# Patient Record
Sex: Female | Born: 1935 | Race: White | Hispanic: No
Health system: Southern US, Community
[De-identification: ages and names within clinical notes are randomized; demographics above are authoritative.]

---

## 2007-04-11 ENCOUNTER — Inpatient Hospital Stay: Payer: Self-pay | Admitting: Internal Medicine

## 2007-04-19 ENCOUNTER — Inpatient Hospital Stay: Payer: Self-pay | Admitting: General Surgery

## 2007-04-19 ENCOUNTER — Other Ambulatory Visit: Payer: Self-pay

## 2007-04-20 ENCOUNTER — Other Ambulatory Visit: Payer: Self-pay

## 2008-04-05 IMAGING — CT CT ABD-PELV W/O CM
1 of 3 series · 12 of 32 positions shown, 18 images · non-contrast
Comparison: none

REASON FOR EXAM: (1) pain; (2) r/o diverticulitis
COMMENTS:

[Series 2: abdomen · axial · 0.97mm/px · z∈[-567,-232]mm · 12 of 81 slices shown, 18 images]
[im 7/81  soft-tissue]
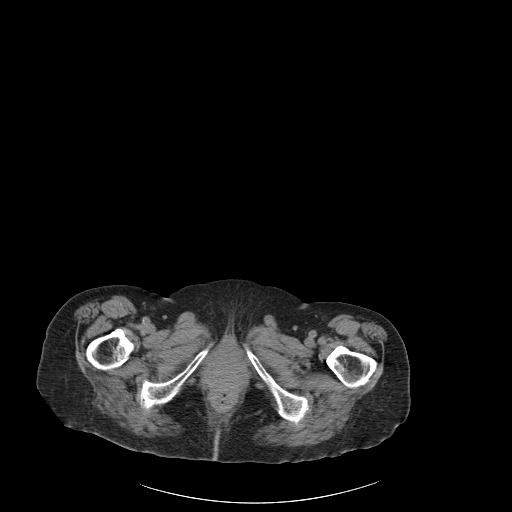
[im 7/81  bone]
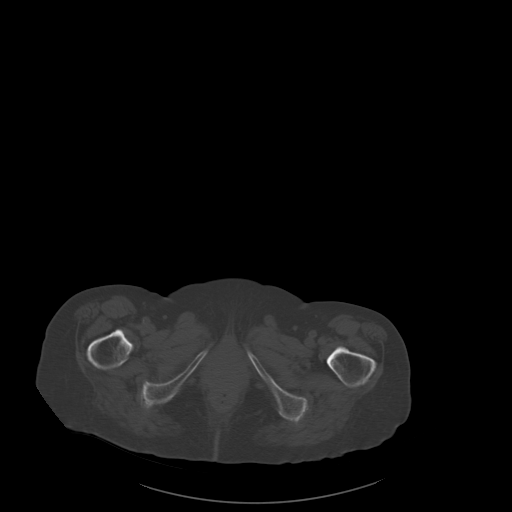
[im 13/81  soft-tissue]
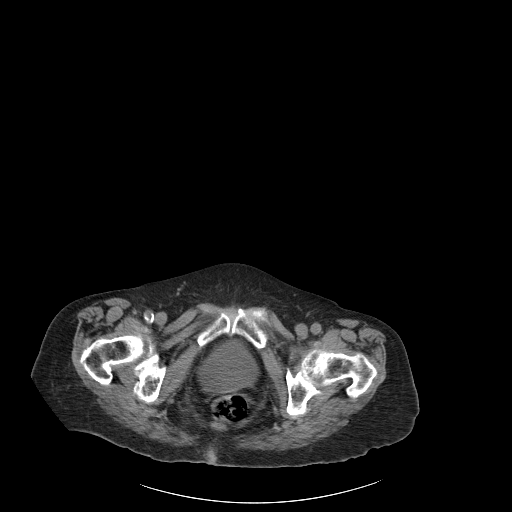
[im 19/81  soft-tissue]
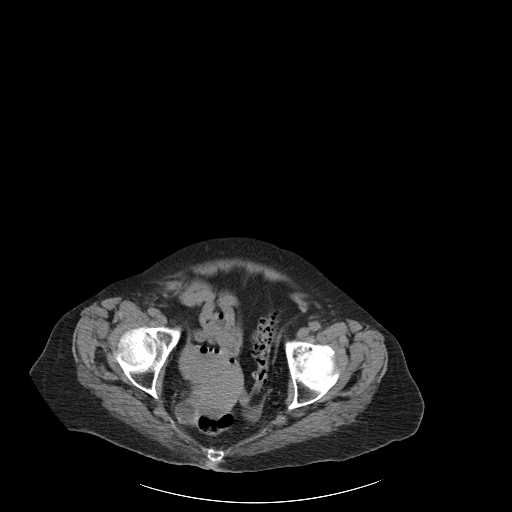
[im 25/81  soft-tissue]
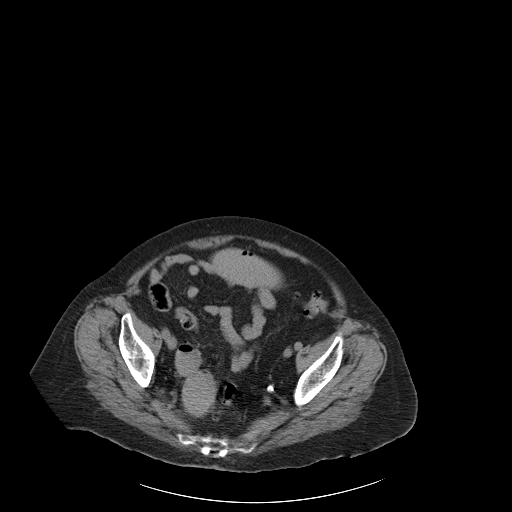
[im 31/81  soft-tissue]
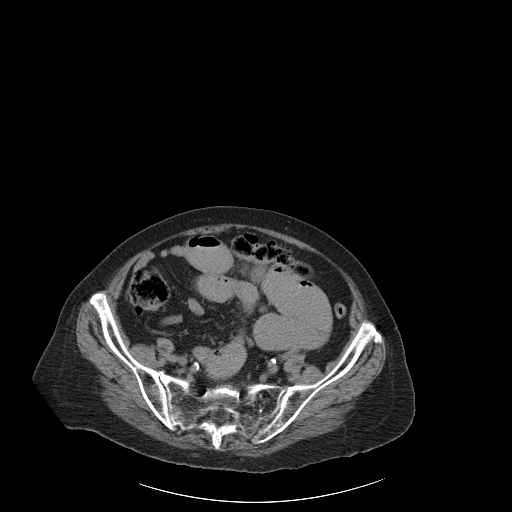
[im 37/81  soft-tissue]
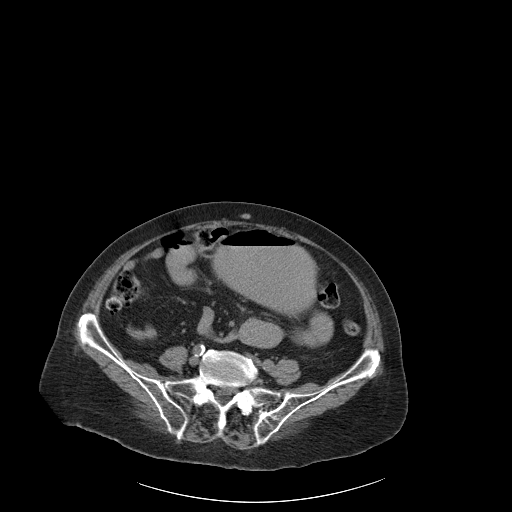
[im 44/81  soft-tissue]
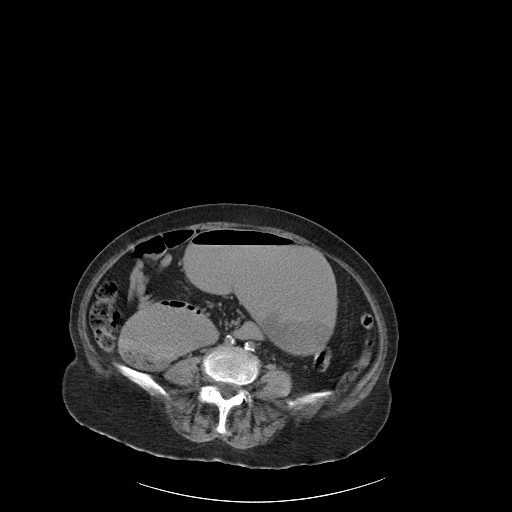
[im 50/81  soft-tissue]
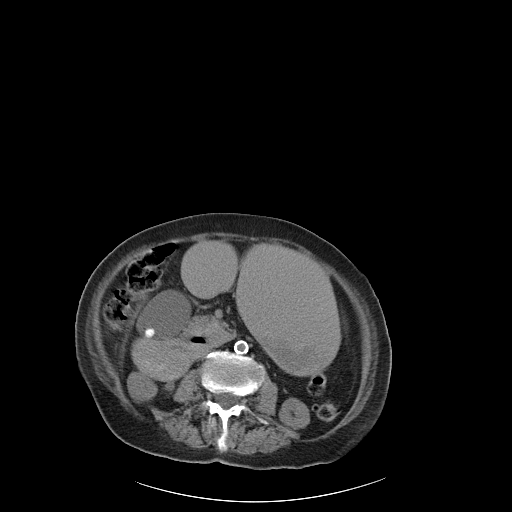
[im 56/81  soft-tissue]
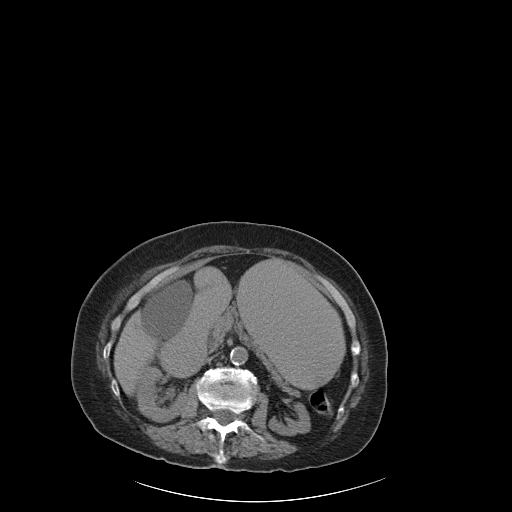
[im 56/81  lung]
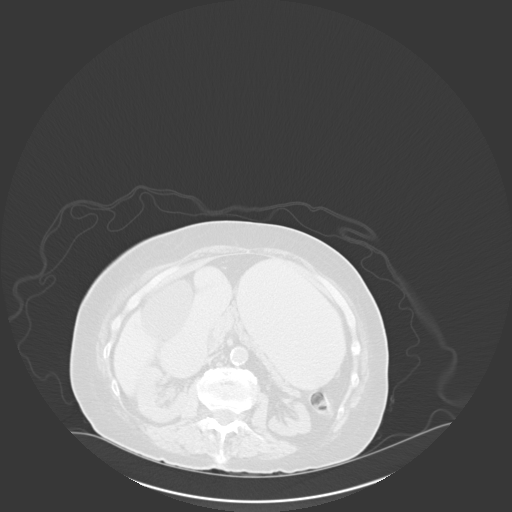
[im 56/81  bone]
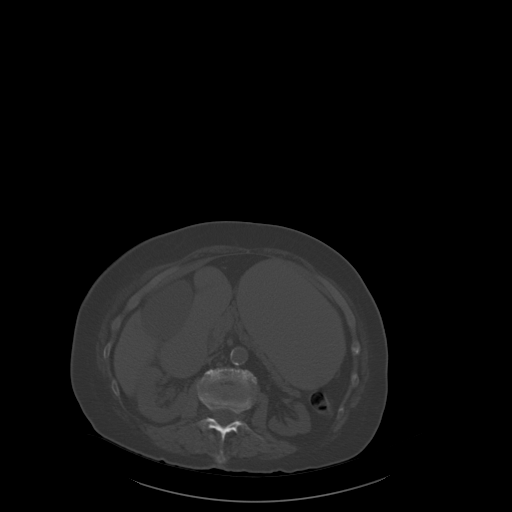
[im 62/81  soft-tissue]
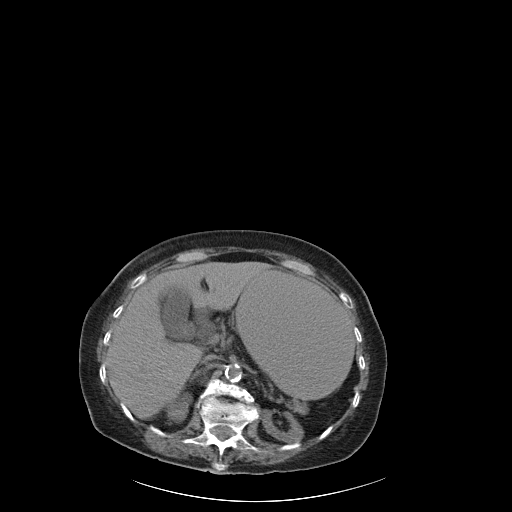
[im 62/81  lung]
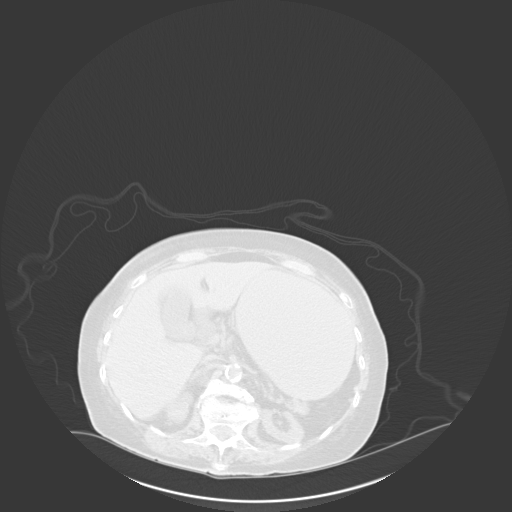
[im 68/81  soft-tissue]
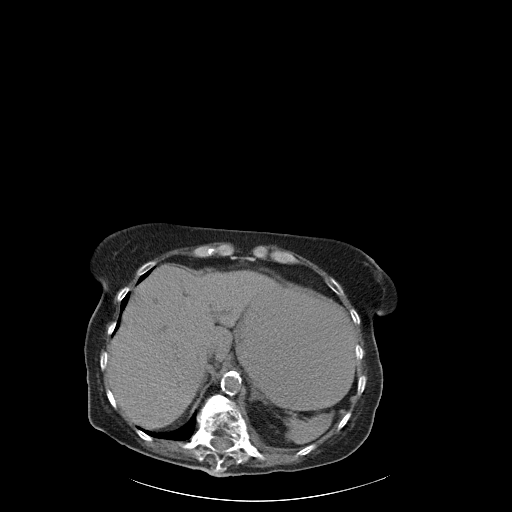
[im 68/81  lung]
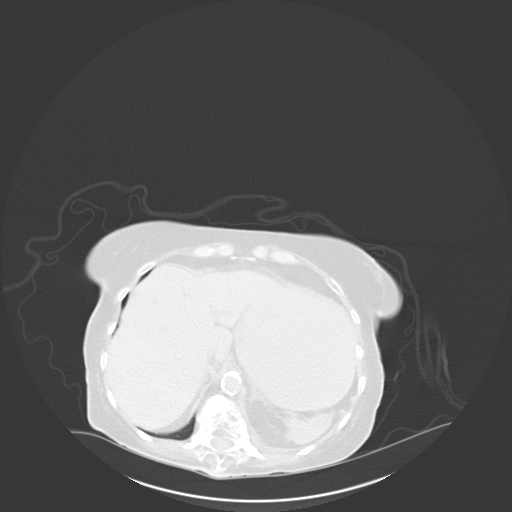
[im 74/81  soft-tissue]
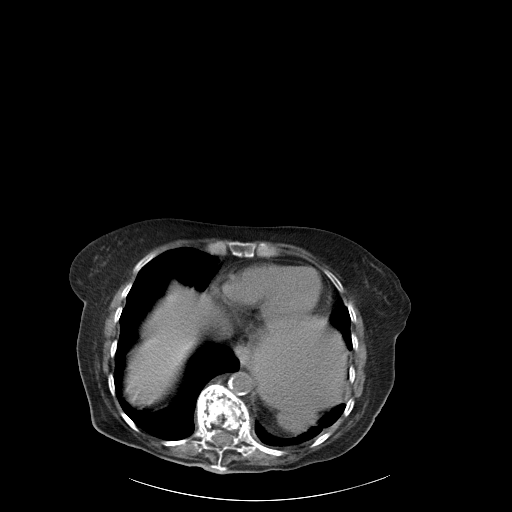
[im 74/81  lung]
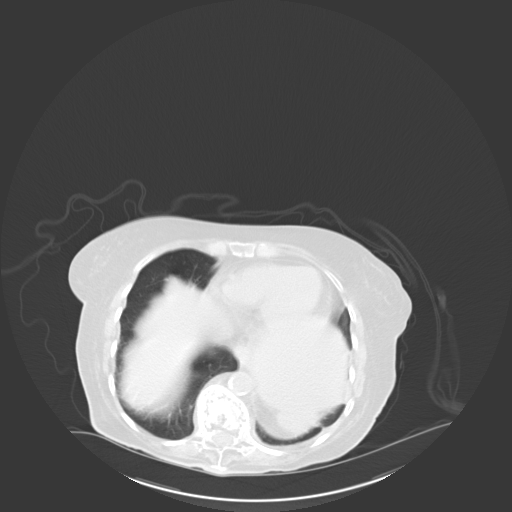

[12 of 32 positions shown; findings below may reference images not displayed]

PROCEDURE:     CT  - CT ABDOMEN AND PELVIS W[DATE] [DATE]

RESULT:      Noncontrasted 5-mm sections were obtained from the lung bases
through the pubic symphysis.

Evaluation of the lung bases demonstrates an area of increased density
within the RIGHT lung base. This is ill-defined and may represent a region
of atelectasis versus infiltrate.

Noncontrast evaluation of the liver, spleen, adrenals, pancreas, and kidneys
are unremarkable.  This is within the limitations of a noncontrast CT.
Calcified dependent gallstones are demonstrated within the gallbladder.

The stomach is distended and appears to be filled with fluid.  Multiple
dilated loops of small bowel are appreciated throughout the abdomen. These
appear to be fluid-filled.  Decompressed colon is identified with areas of
stool present.  Moderate to severe diverticulosis is demonstrated within the
sigmoid colon.  Decompressed small bowel is also demonstrated within the
pelvis.  A transition point is appreciated with the central mid to upper
deep pelvic region within the small bowel.  Loculated rounded areas of low
attenuation project within the posterior RIGHT and LEFT adnexal regions.  No
drainable loculated fluid or free fluid is appreciated.
IMPRESSION: Findings which appear to reflect the sequela of a small bowel obstruction
with a transition point appreciated within the upper to mid deep pelvis
region.  These findings may represent the sequela of an adhesion.  The
stomach is dilated.  There is evidence of moderate to severe diverticulosis
without CT evidence of diverticulitis within the sigmoid colon.  There
appears to be two loculated low attenuating areas within the posterior
aspect of the adnexal regions possibly representing ovarian cysts. Bilateral
cystic ovarian neoplastic disease cannot be completely excluded though is of
lower differential consideration. This can be monitored if and as clinically
warranted particularly considering the patient's age.

## 2008-04-06 IMAGING — CR DG ABDOMEN 2V
1 series · 2 of 2 positions shown · non-contrast
Comparison: none

REASON FOR EXAM: N&V: distention
COMMENTS:

[Series 1: view not recorded · 0.17mm/px · 2 of 2 slices shown]
[im 1/2]
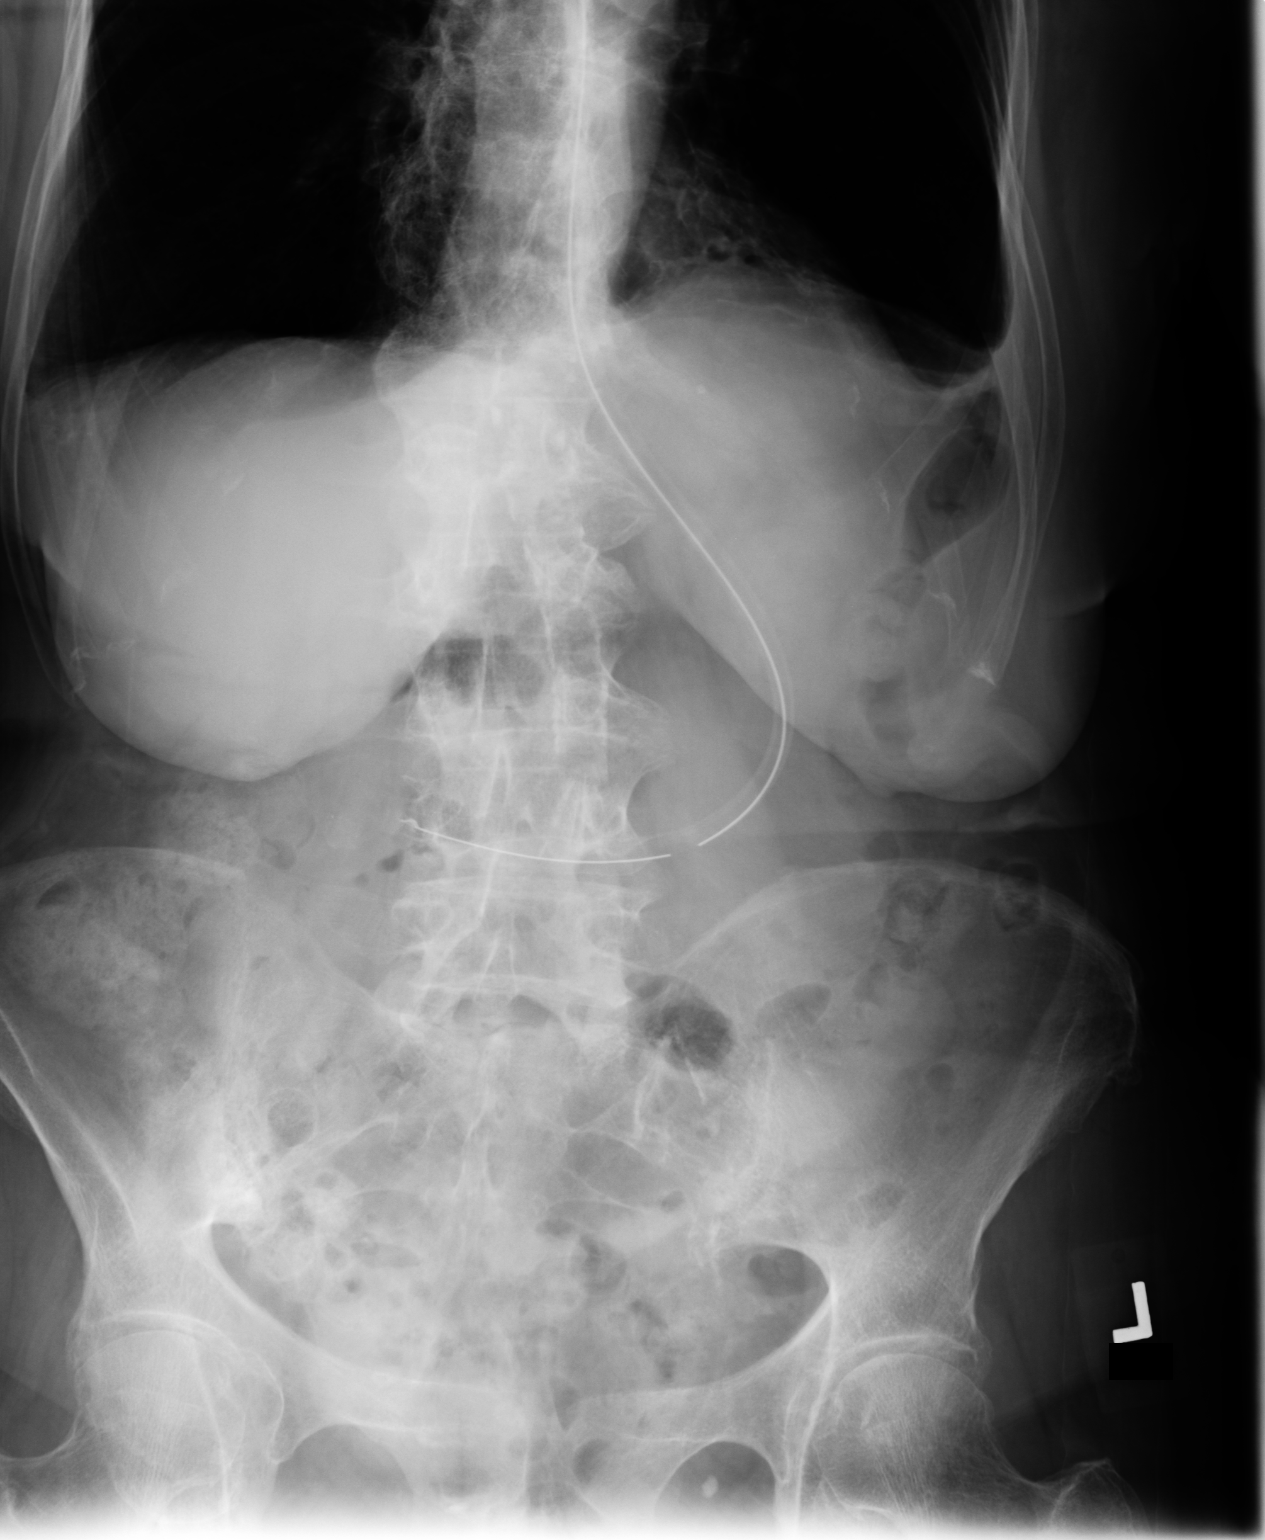
[im 2/2]
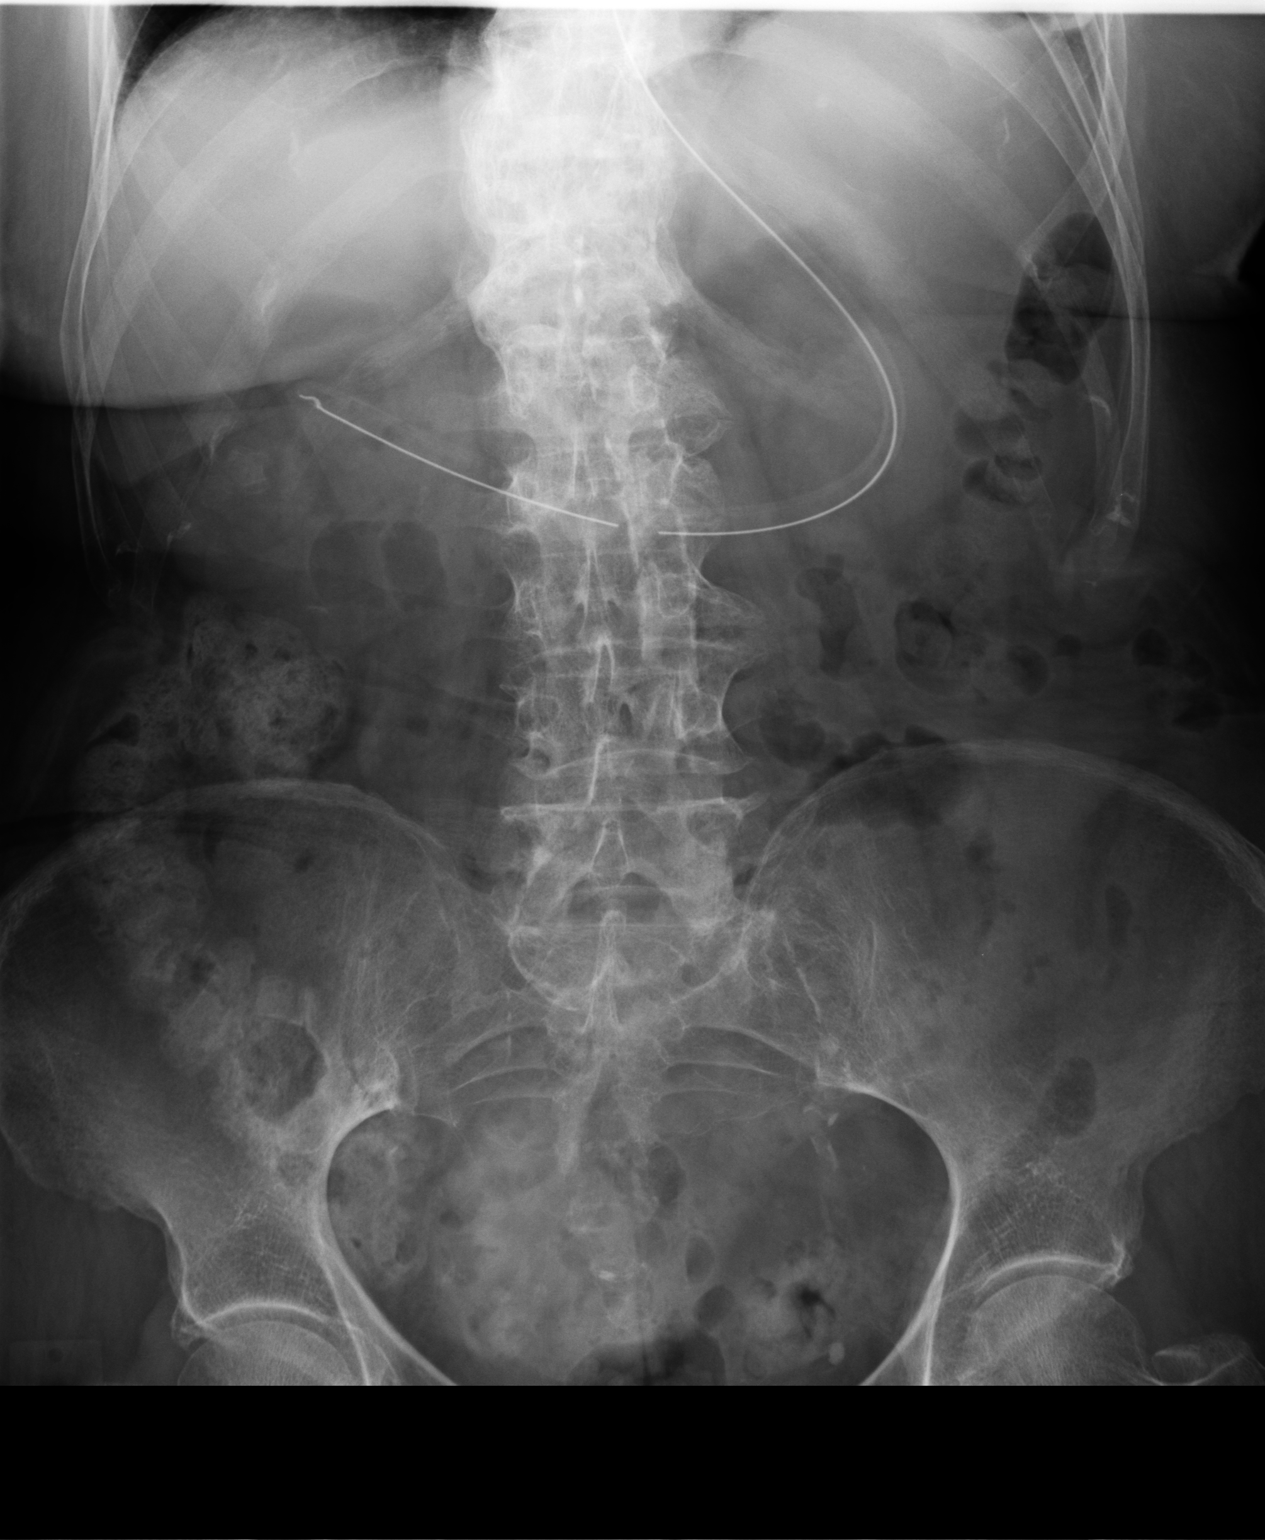

[2 of 2 positions shown; findings below may reference images not displayed]

PROCEDURE:     DXR - DXR ABDOMEN 2 V FLAT AND ERECT  - April 12, 2007  [DATE]

RESULT:     An esophagogastric tube is present with the tip in the region of
the pylorus. No air-fluid levels are present. There is no free air. There is
again noted blunting the left costophrenic angle. There is air, stool and
what appears to be oral contrast from the recent CT in the colon. No
abnormal small bowel distention is evident. Degenerative bony changes are
present. Atherosclerotic calcification is noted.
IMPRESSION: No plain film evidence of obstruction at this time. Oral
contrast from the CT was passed into the rectum and is present to the region
of the rectosigmoid. An esophagogastric tube remains in place.

## 2008-04-08 IMAGING — CR DG ABDOMEN 2V
1 series · 2 of 2 positions shown · non-contrast
Comparison: none

REASON FOR EXAM: sbo
COMMENTS:

[Series 1: view not recorded · 0.17mm/px · 2 of 2 slices shown]
[im 1/2]
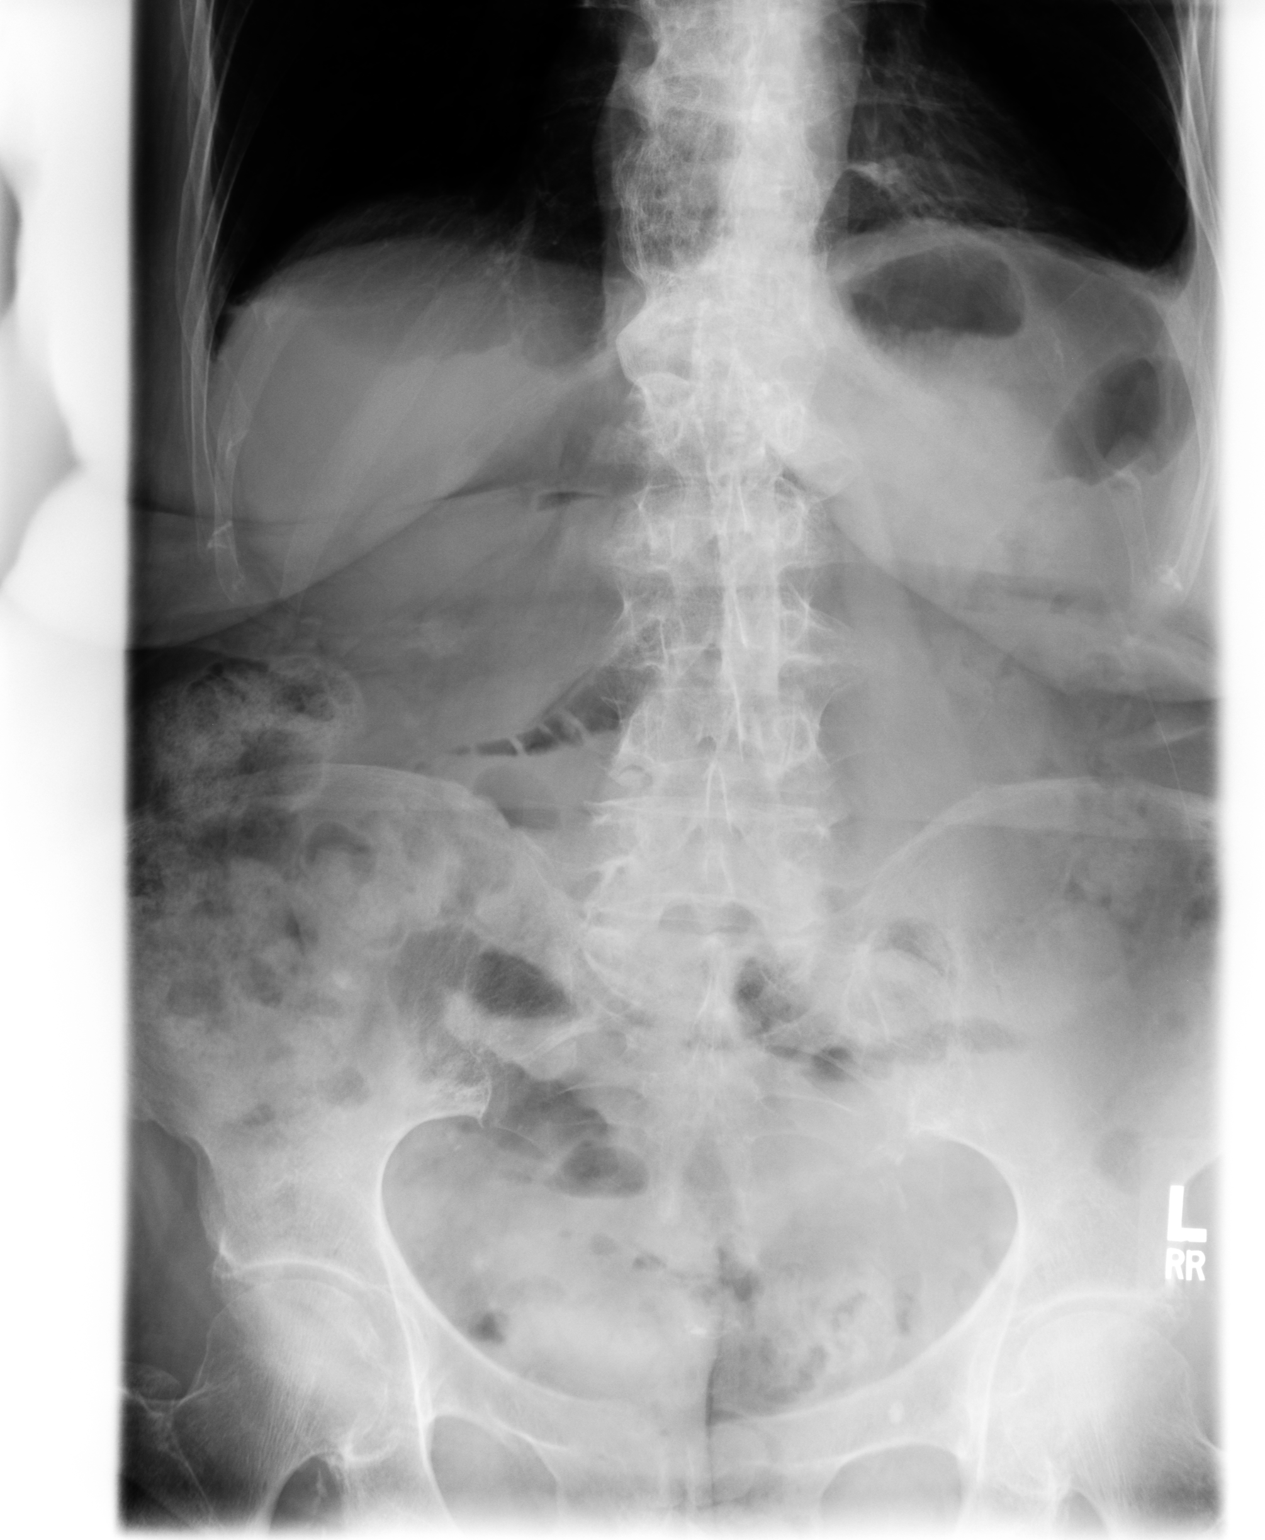
[im 2/2]
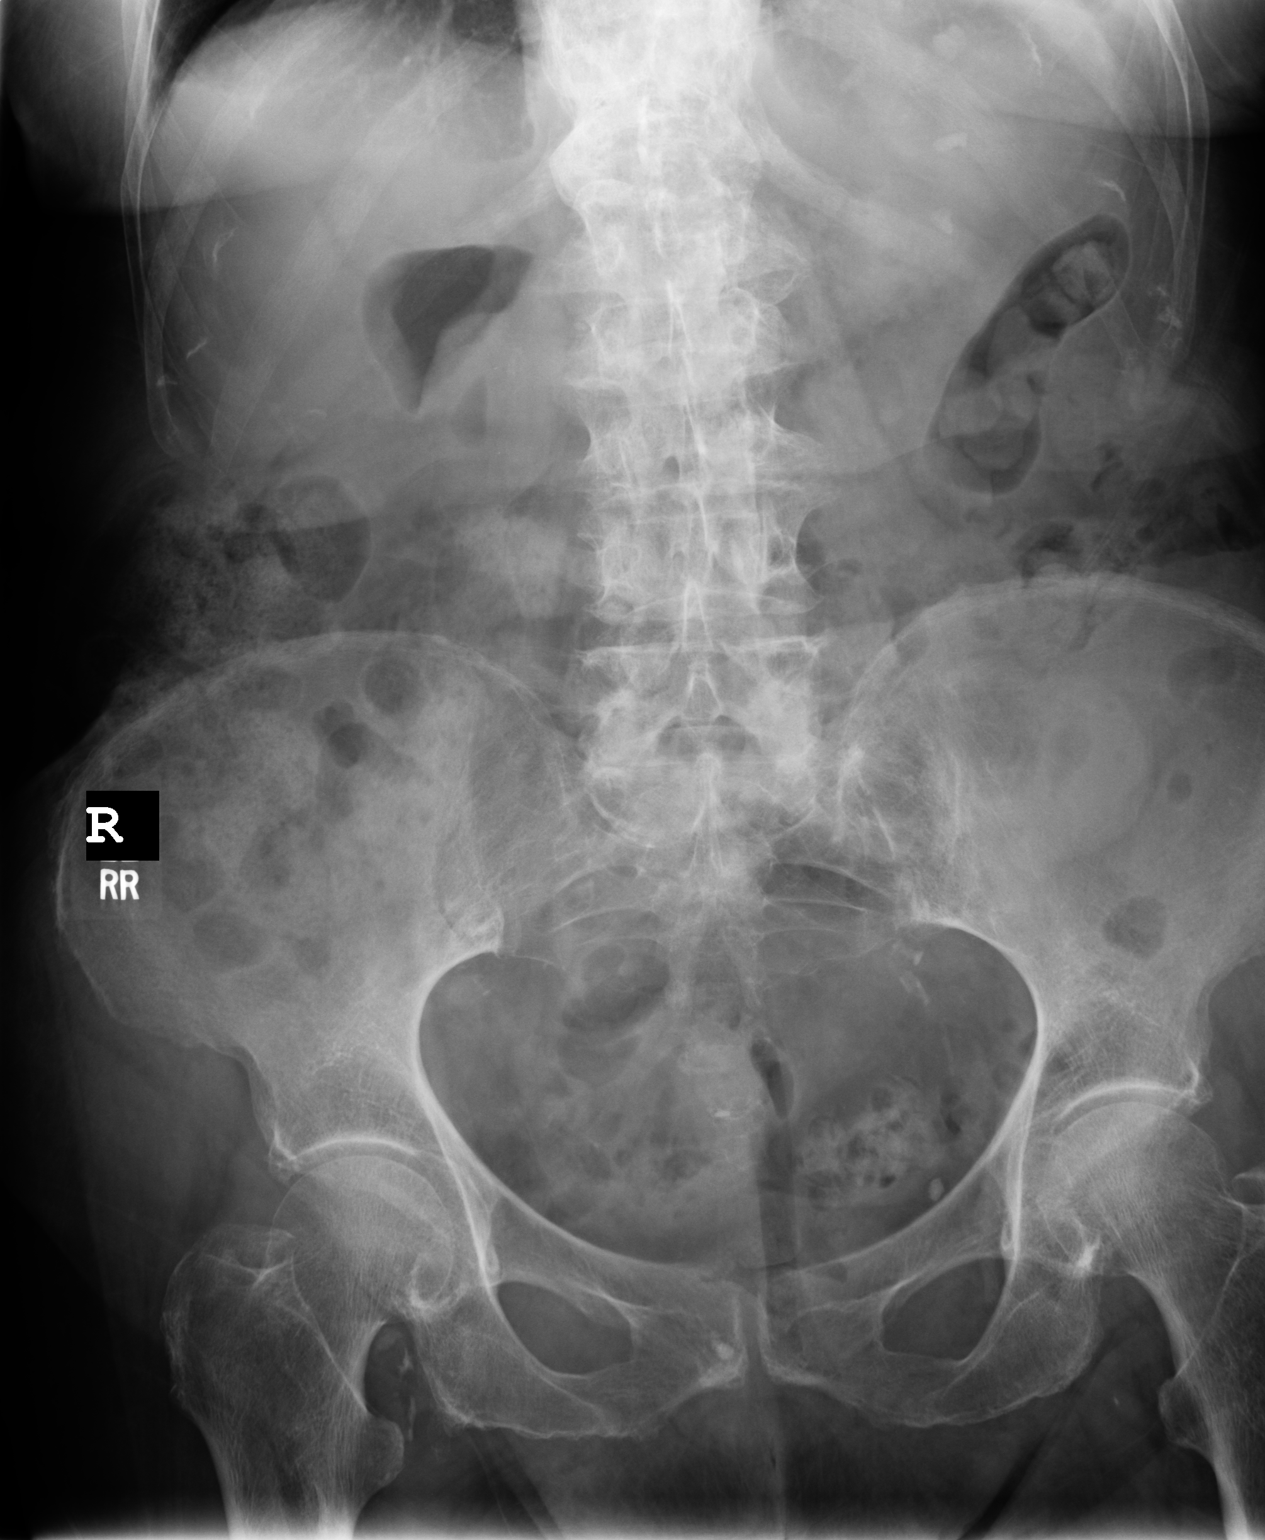

[2 of 2 positions shown; findings below may reference images not displayed]

PROCEDURE:     DXR - DXR ABDOMEN 2 V FLAT AND ERECT  - April 14, 2007  [DATE]

RESULT:     Supine and erect views of the abdomen demonstrate air and stool
scattered through the colon to the rectum. Bony degenerative changes and
atherosclerotic calcification are noted. There is no evidence of free air.
No significant bowel distention is seen. There is residual opacification
within the colon from the previous oral contrast consumed for a CT.
IMPRESSION: 1. No bowel obstruction suggested.
2. Atherosclerotic changes.
3. Bony degenerative changes.

## 2008-04-10 IMAGING — CR DG ABDOMEN 2V
1 series · 2 of 2 positions shown · non-contrast
Comparison: none

REASON FOR EXAM: Obstruction
COMMENTS:

[Series 1: view not recorded · 0.17mm/px · 2 of 2 slices shown]
[im 1/2]
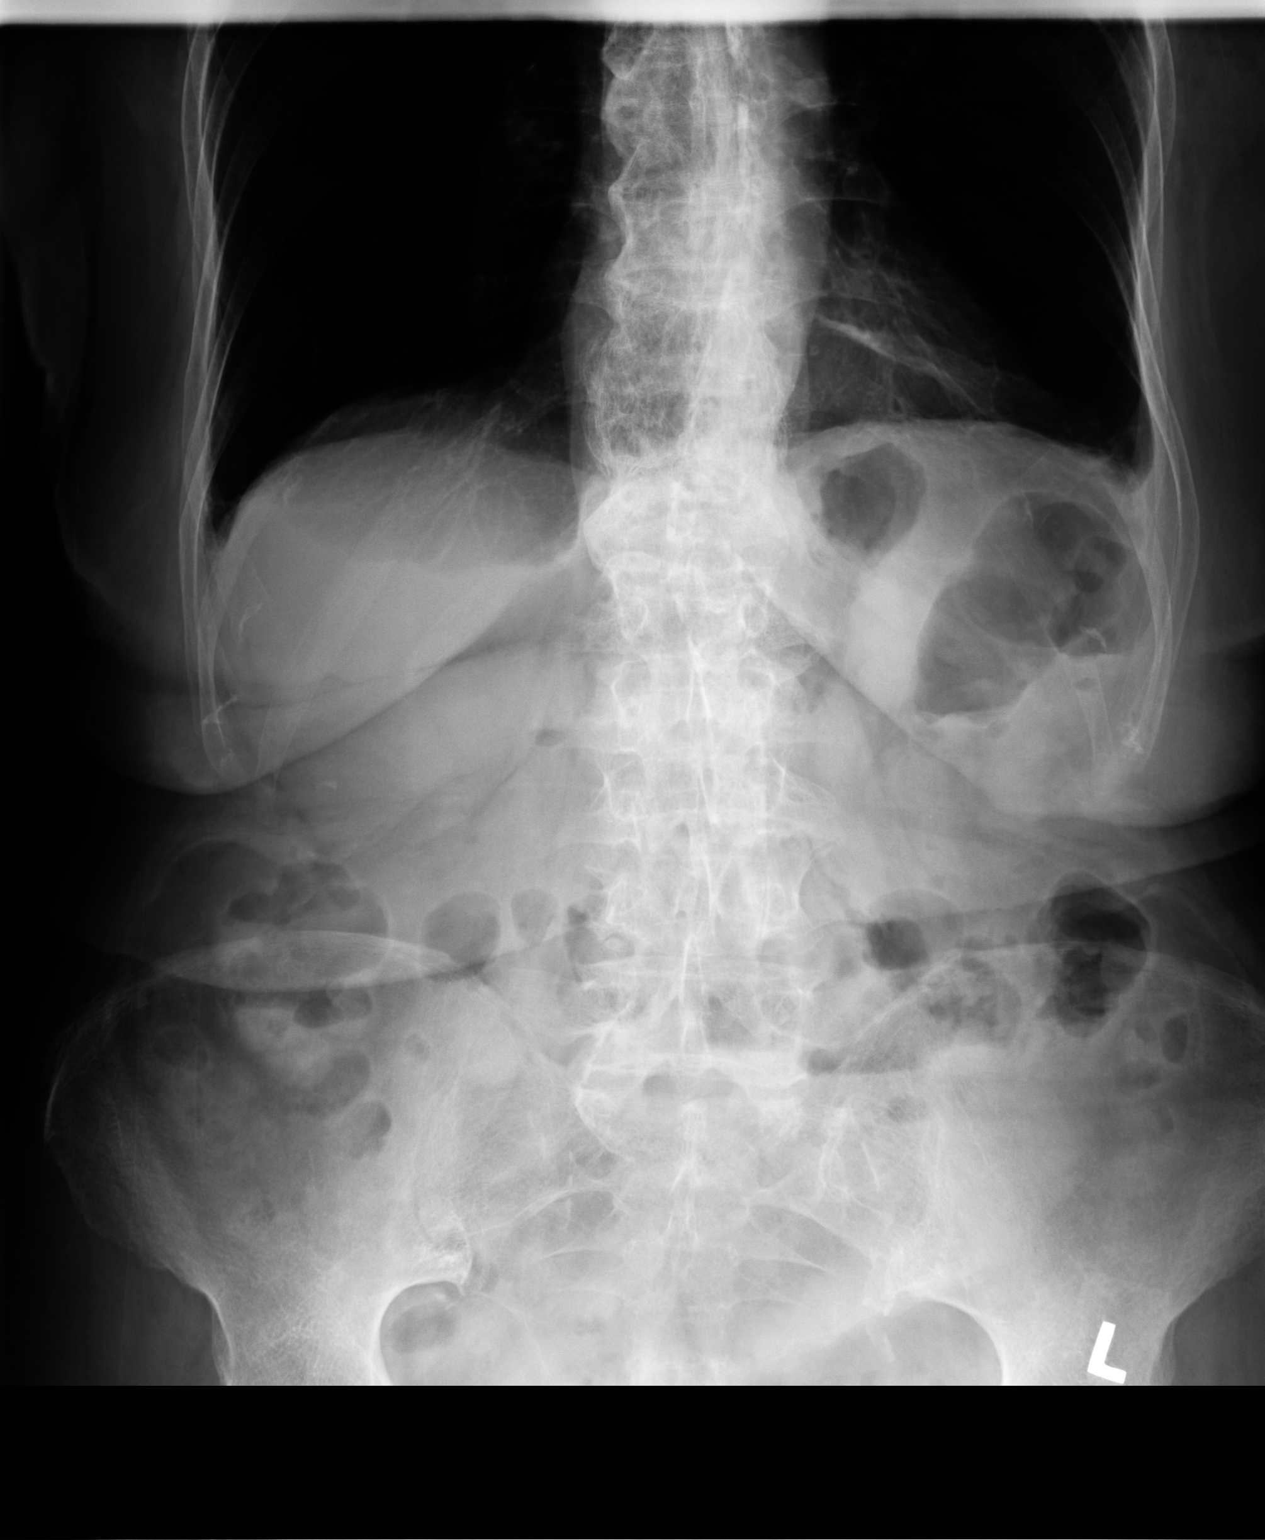
[im 2/2]
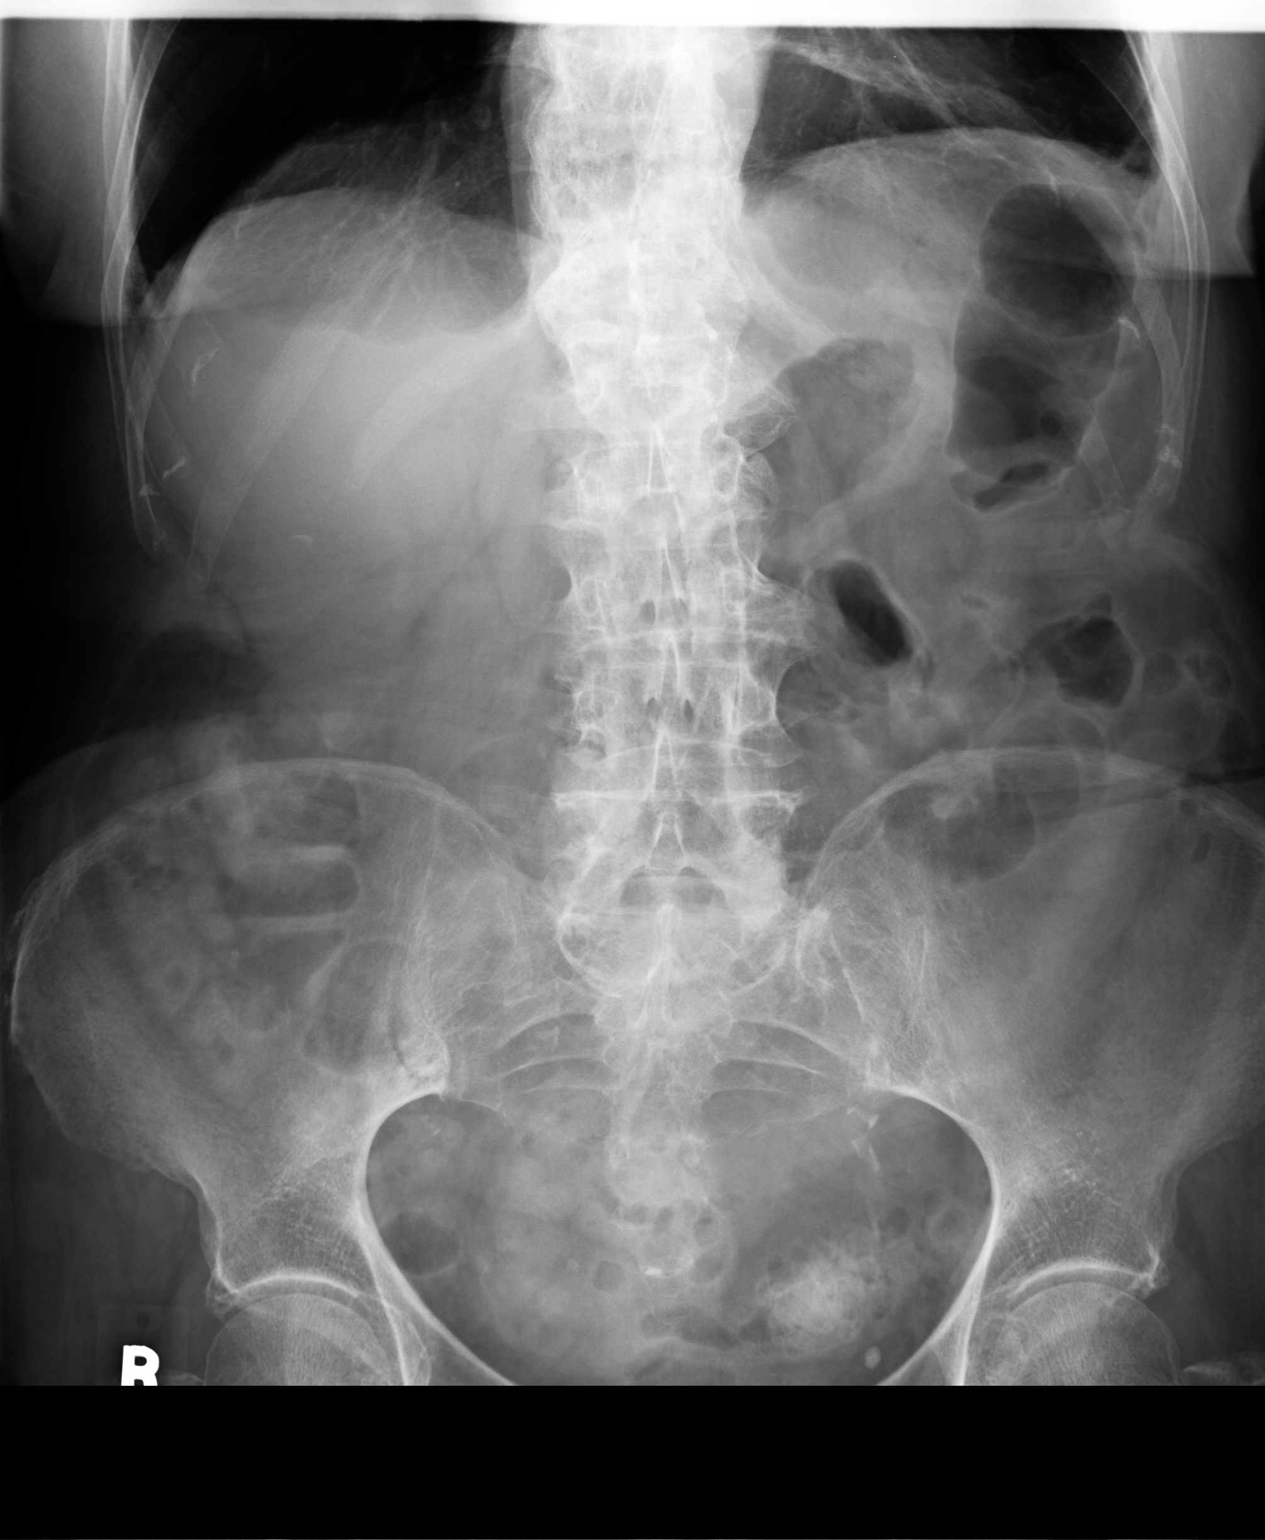

[2 of 2 positions shown; findings below may reference images not displayed]

PROCEDURE:     DXR - DXR ABDOMEN 2 V FLAT AND ERECT  - April 16, 2007  [DATE]

RESULT:     Supine and erect views are compared to images dated 04/15/2007.
The oral contrast seen on previous studies from the CT is almost completely
cleared with a little bit remaining in the colon. There does not appear to
be significant small bowel distention. No gastric distention or free air is
evident. Degenerative bony changes are present. Phleboliths appear to be
present. Atherosclerotic calcification is noted. There is a band of density
at the left lung base suggestive of atelectasis.
IMPRESSION: No definite obstruction demonstrated.

## 2008-04-14 IMAGING — CR DG CHOLANGIOGRAM OPERATIVE
1 series · 1 of 1 positions shown · non-contrast
Comparison: none

REASON FOR EXAM: Post-op
COMMENTS:

[view not recorded]
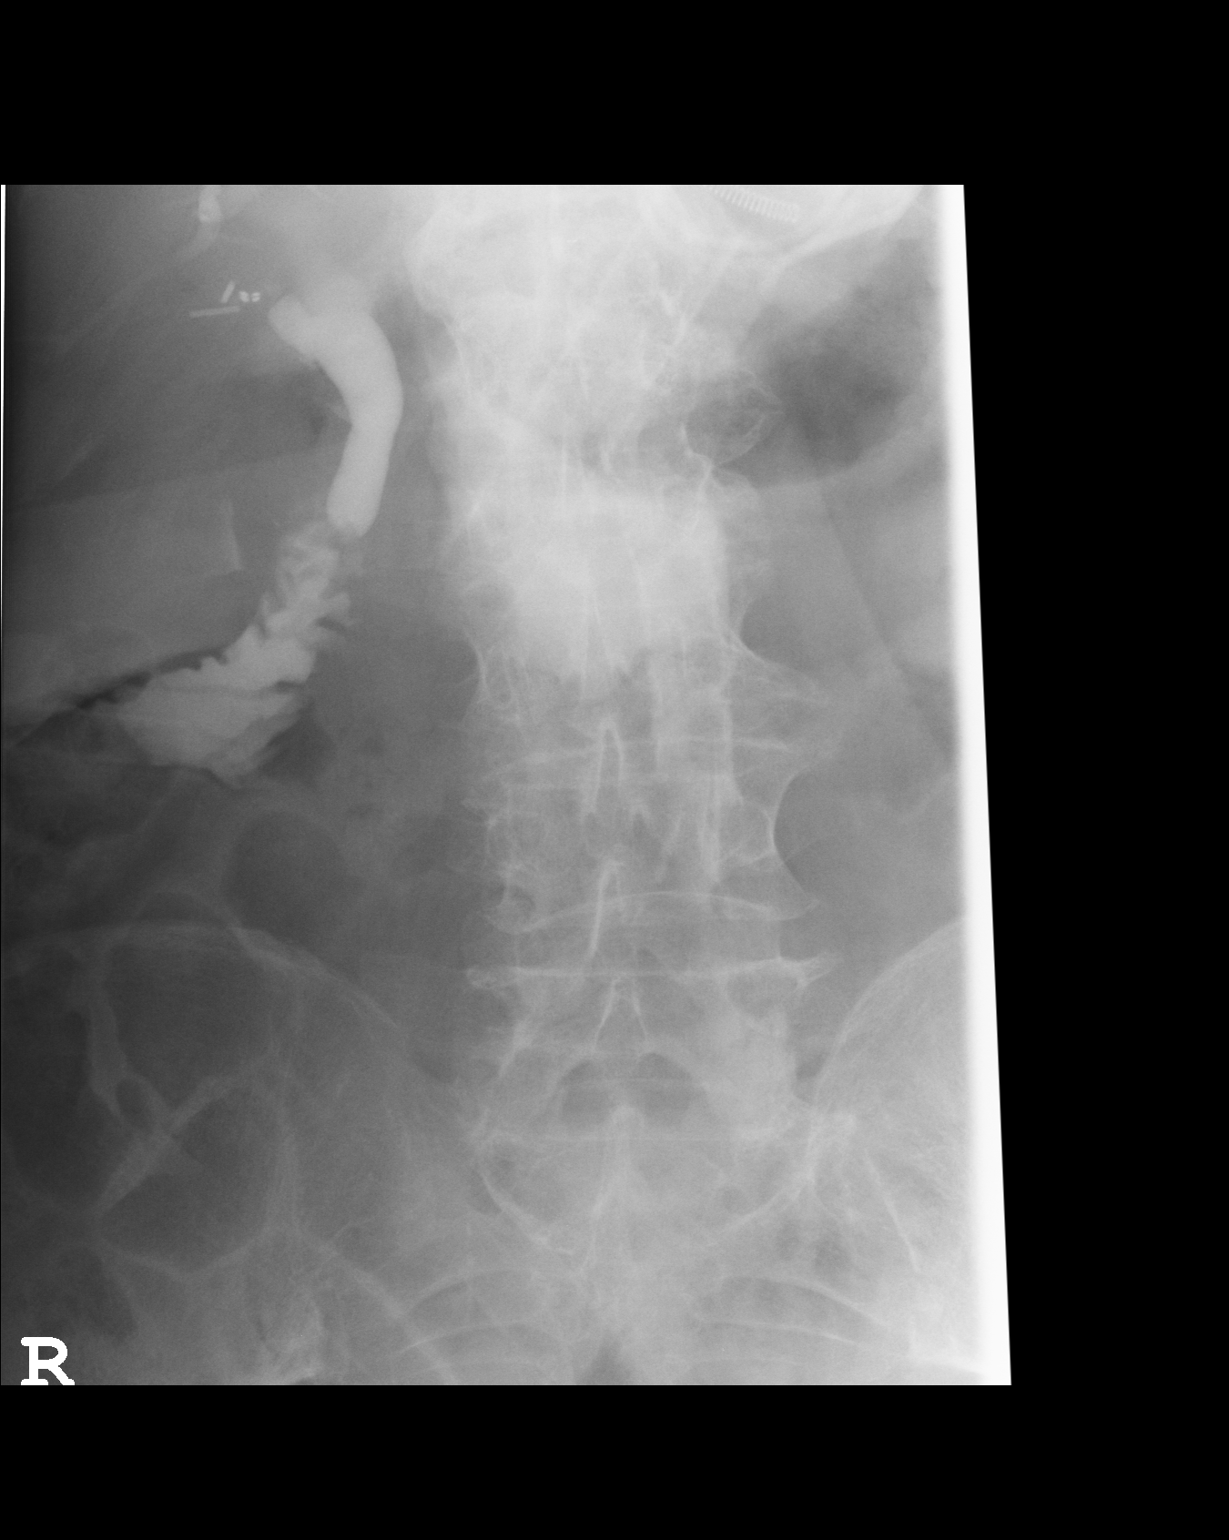

[1 of 1 positions shown; findings below may reference images not displayed]

PROCEDURE:     DXR - DXR CHOLANGIOGRAM OP (INITIAL)  - April 20, 2007  [DATE]

RESULT:     Contrast material is visualized in the common duct. The caliber
of the common duct decreases abruptly above the ampulla where there is a
concave defect in the contrast column. This is suspicious for a partially
obstructing stone. There is visualized contrast in the duodenum indicating
the absence of any complete obstruction.
IMPRESSION: Possible partial obstruction of the distal common duct.

## 2014-05-14 ENCOUNTER — Inpatient Hospital Stay: Payer: Self-pay | Admitting: Internal Medicine

## 2014-05-14 LAB — URINALYSIS, COMPLETE
Bilirubin,UR: NEGATIVE
Glucose,UR: NEGATIVE mg/dL (ref 0–75)
Ketone: NEGATIVE
Leukocyte Esterase: NEGATIVE
Nitrite: NEGATIVE
PH: 5 (ref 4.5–8.0)
RBC,UR: 1 /HPF (ref 0–5)
SPECIFIC GRAVITY: 1.018 (ref 1.003–1.030)
Squamous Epithelial: NONE SEEN

## 2014-05-14 LAB — CBC WITH DIFFERENTIAL/PLATELET
Basophil #: 0.2 10*3/uL — ABNORMAL HIGH (ref 0.0–0.1)
Basophil %: 1.2 %
EOS ABS: 0.2 10*3/uL (ref 0.0–0.7)
Eosinophil %: 1.2 %
HCT: 29.5 % — ABNORMAL LOW (ref 35.0–47.0)
HGB: 9.2 g/dL — ABNORMAL LOW (ref 12.0–16.0)
LYMPHS ABS: 1.4 10*3/uL (ref 1.0–3.6)
Lymphocyte %: 8.8 %
MCH: 29.1 pg (ref 26.0–34.0)
MCHC: 31.3 g/dL — ABNORMAL LOW (ref 32.0–36.0)
MCV: 93 fL (ref 80–100)
Monocyte #: 0.6 x10 3/mm (ref 0.2–0.9)
Monocyte %: 3.6 %
Neutrophil #: 14 10*3/uL — ABNORMAL HIGH (ref 1.4–6.5)
Neutrophil %: 85.2 %
Platelet: 376 10*3/uL (ref 150–440)
RBC: 3.16 10*6/uL — ABNORMAL LOW (ref 3.80–5.20)
RDW: 14.7 % — AB (ref 11.5–14.5)
WBC: 16.4 10*3/uL — ABNORMAL HIGH (ref 3.6–11.0)

## 2014-05-14 LAB — COMPREHENSIVE METABOLIC PANEL
AST: 39 U/L — AB (ref 15–37)
Albumin: 2.8 g/dL — ABNORMAL LOW (ref 3.4–5.0)
Alkaline Phosphatase: 165 U/L — ABNORMAL HIGH
Anion Gap: 9 (ref 7–16)
BILIRUBIN TOTAL: 0.2 mg/dL (ref 0.2–1.0)
BUN: 35 mg/dL — ABNORMAL HIGH (ref 7–18)
CO2: 21 mmol/L (ref 21–32)
Calcium, Total: 8.8 mg/dL (ref 8.5–10.1)
Chloride: 108 mmol/L — ABNORMAL HIGH (ref 98–107)
Creatinine: 1.15 mg/dL (ref 0.60–1.30)
EGFR (Non-African Amer.): 39 — ABNORMAL LOW
GFR CALC AF AMER: 46 — AB
Glucose: 180 mg/dL — ABNORMAL HIGH (ref 65–99)
OSMOLALITY: 288 (ref 275–301)
Potassium: 4.6 mmol/L (ref 3.5–5.1)
SGPT (ALT): 28 U/L (ref 12–78)
Sodium: 138 mmol/L (ref 136–145)
Total Protein: 7.1 g/dL (ref 6.4–8.2)

## 2014-05-14 LAB — PHOSPHORUS: PHOSPHORUS: 3.7 mg/dL (ref 2.5–4.9)

## 2014-05-14 LAB — PROTIME-INR
INR: 1
Prothrombin Time: 13.1 secs (ref 11.5–14.7)

## 2014-05-14 LAB — TROPONIN I
TROPONIN-I: 0.58 ng/mL — AB
TROPONIN-I: 0.55 ng/mL — AB
Troponin-I: 0.58 ng/mL — ABNORMAL HIGH

## 2014-05-14 LAB — PRO B NATRIURETIC PEPTIDE: B-Type Natriuretic Peptide: 16061 pg/mL — ABNORMAL HIGH (ref 0–450)

## 2014-05-14 LAB — CK-MB
CK-MB: 3.2 ng/mL (ref 0.5–3.6)
CK-MB: 3.1 ng/mL (ref 0.5–3.6)
CK-MB: 3.4 ng/mL (ref 0.5–3.6)

## 2014-05-14 LAB — MAGNESIUM: Magnesium: 2.1 mg/dL

## 2014-05-15 LAB — BASIC METABOLIC PANEL
Anion Gap: 9 (ref 7–16)
BUN: 25 mg/dL — AB (ref 7–18)
Calcium, Total: 8.8 mg/dL (ref 8.5–10.1)
Chloride: 110 mmol/L — ABNORMAL HIGH (ref 98–107)
Co2: 22 mmol/L (ref 21–32)
Creatinine: 1.14 mg/dL (ref 0.60–1.30)
EGFR (African American): 46 — ABNORMAL LOW
EGFR (Non-African Amer.): 40 — ABNORMAL LOW
GLUCOSE: 109 mg/dL — AB (ref 65–99)
Osmolality: 286 (ref 275–301)
Potassium: 4.9 mmol/L (ref 3.5–5.1)
SODIUM: 141 mmol/L (ref 136–145)

## 2014-05-15 LAB — CBC WITH DIFFERENTIAL/PLATELET
BASOS ABS: 0 10*3/uL (ref 0.0–0.1)
Basophil %: 0.3 %
EOS ABS: 0 10*3/uL (ref 0.0–0.7)
Eosinophil %: 0.2 %
HCT: 26.9 % — ABNORMAL LOW (ref 35.0–47.0)
HGB: 8.3 g/dL — ABNORMAL LOW (ref 12.0–16.0)
Lymphocyte #: 1 10*3/uL (ref 1.0–3.6)
Lymphocyte %: 8.8 %
MCH: 28.7 pg (ref 26.0–34.0)
MCHC: 30.9 g/dL — ABNORMAL LOW (ref 32.0–36.0)
MCV: 93 fL (ref 80–100)
Monocyte #: 0.7 x10 3/mm (ref 0.2–0.9)
Monocyte %: 6.6 %
NEUTROS ABS: 9.4 10*3/uL — AB (ref 1.4–6.5)
NEUTROS PCT: 84.1 %
Platelet: 357 10*3/uL (ref 150–440)
RBC: 2.89 10*6/uL — AB (ref 3.80–5.20)
RDW: 14.5 % (ref 11.5–14.5)
WBC: 11.2 10*3/uL — AB (ref 3.6–11.0)

## 2014-05-15 LAB — MAGNESIUM: MAGNESIUM: 2.2 mg/dL

## 2014-05-16 LAB — CBC WITH DIFFERENTIAL/PLATELET
Basophil #: 0 10*3/uL (ref 0.0–0.1)
Basophil %: 0.3 %
EOS ABS: 0 10*3/uL (ref 0.0–0.7)
EOS PCT: 0 %
HCT: 27 % — ABNORMAL LOW (ref 35.0–47.0)
HGB: 8.7 g/dL — ABNORMAL LOW (ref 12.0–16.0)
Lymphocyte #: 0.5 10*3/uL — ABNORMAL LOW (ref 1.0–3.6)
Lymphocyte %: 4.3 %
MCH: 29.5 pg (ref 26.0–34.0)
MCHC: 32.1 g/dL (ref 32.0–36.0)
MCV: 92 fL (ref 80–100)
MONOS PCT: 2.1 %
Monocyte #: 0.2 x10 3/mm (ref 0.2–0.9)
NEUTROS ABS: 10.5 10*3/uL — AB (ref 1.4–6.5)
Neutrophil %: 93.3 %
Platelet: 397 10*3/uL (ref 150–440)
RBC: 2.94 10*6/uL — AB (ref 3.80–5.20)
RDW: 14.5 % (ref 11.5–14.5)
WBC: 11.3 10*3/uL — ABNORMAL HIGH (ref 3.6–11.0)

## 2014-05-16 LAB — BASIC METABOLIC PANEL
Anion Gap: 9 (ref 7–16)
BUN: 32 mg/dL — ABNORMAL HIGH (ref 7–18)
CALCIUM: 8.9 mg/dL (ref 8.5–10.1)
CREATININE: 1.2 mg/dL (ref 0.60–1.30)
Chloride: 108 mmol/L — ABNORMAL HIGH (ref 98–107)
Co2: 22 mmol/L (ref 21–32)
GFR CALC AF AMER: 43 — AB
GFR CALC NON AF AMER: 37 — AB
GLUCOSE: 142 mg/dL — AB (ref 65–99)
Osmolality: 287 (ref 275–301)
Potassium: 4.7 mmol/L (ref 3.5–5.1)
Sodium: 139 mmol/L (ref 136–145)

## 2014-05-16 LAB — URINE CULTURE

## 2014-05-17 LAB — CBC WITH DIFFERENTIAL/PLATELET
Basophil #: 0 10*3/uL (ref 0.0–0.1)
Basophil %: 0.1 %
Eosinophil #: 0 10*3/uL (ref 0.0–0.7)
Eosinophil %: 0 %
HCT: 27.4 % — ABNORMAL LOW (ref 35.0–47.0)
HGB: 8.9 g/dL — AB (ref 12.0–16.0)
Lymphocyte #: 0.6 10*3/uL — ABNORMAL LOW (ref 1.0–3.6)
Lymphocyte %: 3.8 %
MCH: 30.1 pg (ref 26.0–34.0)
MCHC: 32.5 g/dL (ref 32.0–36.0)
MCV: 93 fL (ref 80–100)
Monocyte #: 0.3 x10 3/mm (ref 0.2–0.9)
Monocyte %: 2.1 %
Neutrophil #: 14.8 10*3/uL — ABNORMAL HIGH (ref 1.4–6.5)
Neutrophil %: 94 %
Platelet: 443 10*3/uL — ABNORMAL HIGH (ref 150–440)
RBC: 2.96 10*6/uL — ABNORMAL LOW (ref 3.80–5.20)
RDW: 14.7 % — ABNORMAL HIGH (ref 11.5–14.5)
WBC: 15.8 10*3/uL — ABNORMAL HIGH (ref 3.6–11.0)

## 2014-05-18 LAB — BASIC METABOLIC PANEL
ANION GAP: 7 (ref 7–16)
BUN: 48 mg/dL — ABNORMAL HIGH (ref 7–18)
CALCIUM: 9.1 mg/dL (ref 8.5–10.1)
CHLORIDE: 105 mmol/L (ref 98–107)
Co2: 24 mmol/L (ref 21–32)
Creatinine: 1.18 mg/dL (ref 0.60–1.30)
EGFR (African American): 44 — ABNORMAL LOW
EGFR (Non-African Amer.): 38 — ABNORMAL LOW
GLUCOSE: 141 mg/dL — AB (ref 65–99)
Osmolality: 287 (ref 275–301)
Potassium: 5.9 mmol/L — ABNORMAL HIGH (ref 3.5–5.1)
Sodium: 136 mmol/L (ref 136–145)

## 2014-05-18 LAB — CBC WITH DIFFERENTIAL/PLATELET
BASOS PCT: 0.1 %
Basophil #: 0 10*3/uL (ref 0.0–0.1)
EOS ABS: 0 10*3/uL (ref 0.0–0.7)
EOS PCT: 0 %
HCT: 28.3 % — AB (ref 35.0–47.0)
HGB: 8.9 g/dL — ABNORMAL LOW (ref 12.0–16.0)
LYMPHS PCT: 7.6 %
Lymphocyte #: 1 10*3/uL (ref 1.0–3.6)
MCH: 29.2 pg (ref 26.0–34.0)
MCHC: 31.4 g/dL — AB (ref 32.0–36.0)
MCV: 93 fL (ref 80–100)
MONOS PCT: 3.8 %
Monocyte #: 0.5 x10 3/mm (ref 0.2–0.9)
Neutrophil #: 12.2 10*3/uL — ABNORMAL HIGH (ref 1.4–6.5)
Neutrophil %: 88.5 %
Platelet: 430 10*3/uL (ref 150–440)
RBC: 3.04 10*6/uL — ABNORMAL LOW (ref 3.80–5.20)
RDW: 14.9 % — ABNORMAL HIGH (ref 11.5–14.5)
WBC: 13.8 10*3/uL — ABNORMAL HIGH (ref 3.6–11.0)

## 2014-05-19 LAB — CULTURE, BLOOD (SINGLE)

## 2014-06-14 DEATH — deceased

## 2015-03-07 NOTE — Discharge Summary (Signed)
PATIENT NAME:  Holly Roach, Holly Roach MR#:  [id number] DATE OF BIRTH:  Feb 10, 1912  DATE OF ADMISSION:  05/14/2014.  DATE OF DISCHARGE:  05/20/2014.  DISCHARGE DIAGNOSES:  1.  Acute on chronic respiratory failure.  2.  Chronic obstructive pulmonary disease exacerbation.  3.  Pneumonia.  4.  Urinary tract infection with Escherichia coli with extended spectrum beta-lactamase production.  5.  Extreme weakness and ataxia, declining admission to rehabilitation center.   DISCHARGE MEDICATIONS: [facility]. Basically, the patient will be on a prednisone taper in addition to her usual medications, as well as Advair until her current device is gone.  HISTORY AND PHYSICAL: Please see detailed history and physical done on admission.   HOSPITAL COURSE: The patient was admitted short of breath, hypoxic, initially on BiPAP. It was weaned rapidly to nasal cannula. Steroids IV were then added. She improved markedly and rapidly with this over the course of a day or two. She was on Merrem for pneumonia and urinary tract infection as well as Zithromax initially for the pneumonia. That improved her situation markedly. She had 6 days of IV Merrem as well as 4-5 days of Zithromax. She was extremely weak and ataxic walking.  Rehabilitation was recommended by physical therapy and myself, however, the patient and her niece said she would get 24-hour care at home so we are doing that with home health physical therapy.   LABORATORY AND IMAGING:  Other evaluation found blood cultures to be negative. She was mildly anemic with leukocytosis, 16,400, hemoglobin approximately 9. She did have what appeared to be chronic kidney disease, Stage III-IV, with a GFR of 39. Minimally elevated troponins, likely secondary to stress or infection, and shortness of breath. B-type natriuretic peptide was elevated, but that was thought to be stress from her illness and not acute CHF. Echocardiogram was normal as well. Urine  culture was as noted. Glucose was mildly elevated while she was on steroids. Chest x-ray showed some possible pneumonia in the bases for which she was treated, as noted.   It took approximately 35 minutes to do all discharge tasks today.   ____________________________ Marya AmslerMarshall W. Dareen PianoAnderson, MD mwa:lt D: 05/20/2014 07:17:56 ET T: 05/20/2014 07:33:05 ET JOB#: [id number]  cc: Marya AmslerMarshall W. Dareen PianoAnderson, MD, <Dictator> Lauro RegulusMARSHALL W Samaya Boardley MD ELECTRONICALLY SIGNED 05/20/2014 10:06

## 2015-03-07 NOTE — Consult Note (Signed)
Brief Consult Note: Diagnosis: 81 yo with respiratory distress and mild troponin elevation.   Patient was seen by consultant.   Comments: 81 yo female admitted with respiratory distress. Difficult historian but no chest pian EKG sinus tach with no injfury. Mild troponin elevation appears to be demand. Not candidate for invasive evaluation. Apperaas to be abnormall tropoinin and not ami Medical therapy only.  Electronic Signatures: Dalia HeadingFath, Garo Heidelberg A (MD)  (Signed 02-Jul-15 06:13)  Authored: Brief Consult Note   Last Updated: 02-Jul-15 06:13 by Dalia HeadingFath, Carmaleta Youngers A (MD)

## 2015-03-07 NOTE — H&P (Signed)
PATIENT NAME:  Holly Roach, Holly Roach MR#:  [id number] DATE OF BIRTH:  1912-08-08  DATE OF ADMISSION:  05/14/2014  REFERRING PHYSICIAN: [place] Sink. Dolores Frame, MD   PRIMARY CARE PHYSICIAN: Marya Amsler. Dareen Piano, MD   CHIEF COMPLAINT: Shortness of breath and cough.   HISTORY OF PRESENT ILLNESS: This is a 81 year old female who presents with complaints of shortness of breath and cough. Reports she had upper respiratory illness over the last 2 days. As well, reports productive cough with white phlegm. Reports progressive shortness of breath, and the patient had significant wheezing upon presentation, desaturating in the high 80s upon presentation to ED. As well, she reports fever, chills, sweating. She denies any chest pain. The patient's troponins were elevated at 0.53. The patient's chest x-ray was done, which did show evidence of retrocardiac opacity/infiltrate, as well mild vascular congestion. The patient denies any leg edema, any paroxysmal nocturnal dyspnea, even though she reports she usually sleeps sitting for many years now. The patient did require BiPAP due to her work of breathing.   PAST MEDICAL HISTORY:  1. Chronic renal insufficiency.  2. Hypertension.  3. Severe hearing loss.  4. Marked carotid stenosis bilaterally.  5. Hyperlipidemia.  6. Hypothyroidism.  7. Headache.   PAST SURGICAL HISTORY: Hysterectomy and bladder tuck last year.   ALLERGIES: PENICILLIN.   HOME MEDICATIONS:  1. Calcium 500 mg oral daily.  2. Premarin tablets 0.3 mg oral daily.  3. Verapamil tablet extended release 240 mg oral daily.  4. Vitamin D3 capsules 1000 units daily.  5. Simvastatin 40 mg oral daily.   SOCIAL HISTORY: The patient lives at home with her niece. Nonsmoker. No alcohol to speak of.   FAMILY HISTORY: Significant for heart disease in multiple family members, but none at young age.   REVIEW OF SYSTEMS: The patient is extremely hard of hearing. It was difficult to obtain review of systems from her,  but she reports: CONSTITUTIONAL: Fever, chills, fatigue, weakness.  EYES: Denies blurry vision, double vision, inflammation. ENT: Denies tinnitus, ear pain. Has hearing loss.  RESPIRATORY: Denies history of COPD, any hemoptysis. Reports cough, wheezing, productive sputum, white in color.  CARDIOVASCULAR: Denies chest pain, edema, palpitation, syncope.  GASTROINTESTINAL: Denies nausea, vomiting, diarrhea, abdominal pain.  GENITOURINARY: Denies dysuria, hematuria, renal colic.  ENDOCRINE: Denies polyuria, polydipsia, heat or cold intolerance. HEMATOLOGY: Denies anemia, easy bruising, bleeding diathesis.  INTEGUMENTARY: Denies acne, rash or skin lesion.  MUSCULOSKELETAL: Denies any gout, cramps.  NEUROLOGIC: Denies any history of CVA, TIA, ataxia, vertigo, tremor.  PSYCHIATRIC: Denies anxiety, insomnia or depression.   PHYSICAL EXAMINATION:  VITAL SIGNS: Temperature 97.6, pulse 73, respiratory rate 24, blood pressure 121/50 saturating 100% on BiPAP.  GENERAL: Frail elderly female in mild respiratory distress, on BiPAP.  HEENT: Head atraumatic, normocephalic. Pupils equally reactive to light. Pink conjunctivae. Anicteric sclerae. Moist oral mucosa.  NECK: Supple. No thyromegaly. No JVD.  CHEST: Good air entry bilaterally. No rales, but had mild scattered wheezing and rales in the left lung.  CARDIOVASCULAR: S1, S2 heard. No rubs, murmurs or gallops.  ABDOMEN: Soft, nontender, nondistended. Bowel sounds present.  EXTREMITIES: No edema. No clubbing. No cyanosis. Pedal and radial pulses +2 bilaterally.  PSYCHIATRIC: Appropriate affect. Awake, alert x3. Intact judgment and insight.  NEUROLOGIC: Cranial nerves grossly intact. Motor 5 out of 5.  SKIN: Normal skin turgor. Warm and dry.  MUSCULOSKELETAL: No joint effusion or erythema. No back tenderness. No CVA tenderness.   PERTINENT LABORATORY DATA: Glucose 180, BUN 35, creatinine 1.15, sodium  138, potassium 4.6, chloride 108, CO2 21. Troponin  0.58. ALT 28, AST 39, alkaline phosphatase 165. White blood cell 15.4, hemoglobin 9.2, hematocrit 29.5, platelets 376,000. INR is 1. Urinalysis negative except she has nitrite. The pH 7.37, pCO2 of 34, pO2 of 234.   IMAGING: Chest x-ray, preliminary reading by radiology, showing mild vascular congestion and retrocardiac opacity.   ASSESSMENT AND PLAN:  1. Acute respiratory failure, hypoxic upon presentation. This is most likely related to her pneumonia, possible congestive heart failure.  2. Pneumonia. The patient will be admitted to CCU. Will continue with BiPAP. Will start her on meropenem, vancomycin and azithromycin. Will follow on the blood culture.  3. Elevated troponins. This is most likely due to demand ischemia from her hypoxia and respiratory distress. Denies any chest pain. Will give her 324 mg of aspirin. Will continue to cycle her cardiac enzymes, follow the trend. Will consult cardiology.  4. Hypertension, acceptable. Resume her home medication when she is more stable.  5. Hyperlipidemia. Will resume statin when she is more stable. 6. Deep vein thrombosis prophylaxis. Subcutaneous heparin.   CODE STATUS: Discussed with the patient and daughter at bedside, reports the patient is DNR. Has a living will.   TOTAL TIME SPENT ON ADMISSION AND PATIENT CARE: 55 minutes.    ____________________________ Starleen Armsawood S. Primitivo Merkey, MD dse:lb D: 05/14/2014 08:01:30 ET T: 05/14/2014 08:11:49 ET JOB#: [id number]  cc: Starleen Armsawood S. Cyanna Neace, MD, <Dictator> Jeanpierre Thebeau Teena IraniS Charlene Detter MD ELECTRONICALLY SIGNED 05/23/2014 0:50
